# Patient Record
Sex: Female | Born: 2000 | Race: Black or African American | Hispanic: No | Marital: Single | Smoking: Never smoker
Health system: Southern US, Community
[De-identification: ages and names within clinical notes are randomized; demographics above are authoritative.]

## PROBLEM LIST (undated history)

## (undated) DIAGNOSIS — Z00129 Encounter for routine child health examination without abnormal findings: Secondary | ICD-10-CM

## (undated) DIAGNOSIS — Z003 Encounter for examination for adolescent development state: Secondary | ICD-10-CM

## (undated) HISTORY — DX: Encounter for examination for adolescent development state: Z00.3

## (undated) HISTORY — DX: Encounter for routine child health examination without abnormal findings: Z00.129

## (undated) HISTORY — PX: NO PAST SURGERIES: SHX2092

---
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
## 2021-02-26 ENCOUNTER — Ambulatory Visit
Admission: EM | Admit: 2021-02-26 | Discharge: 2021-02-26 | Disposition: A | Discharge status: Home / Self Care | Payer: BLUE CROSS/BLUE SHIELD | Attending: Physician Assistant | Admitting: Physician Assistant

## 2021-02-26 ENCOUNTER — Other Ambulatory Visit: Payer: Self-pay

## 2021-02-26 ENCOUNTER — Encounter: Payer: Self-pay | Admitting: Emergency Medicine

## 2021-02-26 DIAGNOSIS — J029 Acute pharyngitis, unspecified: Secondary | ICD-10-CM | POA: Diagnosis not present

## 2021-02-26 DIAGNOSIS — J0300 Acute streptococcal tonsillitis, unspecified: Secondary | ICD-10-CM | POA: Diagnosis not present

## 2021-02-26 LAB — GROUP A STREP BY PCR: Group A Strep by PCR: DETECTED — AB

## 2021-02-26 MED ORDER — IBUPROFEN 800 MG PO TABS: 800.0000 mg | ORAL_TABLET | Freq: Three times a day (TID) | ORAL | 0 refills | Status: AC | PRN

## 2021-02-26 MED ORDER — LIDOCAINE VISCOUS HCL 2 % MT SOLN: 15.0000 mL | OROMUCOSAL | 0 refills | Status: AC | PRN

## 2021-02-26 MED ORDER — AMOXICILLIN 500 MG PO CAPS: 500.0000 mg | ORAL_CAPSULE | Freq: Two times a day (BID) | ORAL | 0 refills | Status: AC

## 2021-02-26 NOTE — ED Triage Notes (Signed)
Patient c/o sore throat that started Tuesday. Denies any other symptoms.

## 2021-02-26 NOTE — ED Provider Notes (Signed)
[facility]    CSN: [id number] Arrival date & time: 02/26/21  6644      History   Chief Complaint Chief Complaint  Patient presents with  . Sore Throat    HPI Amber Bradley is a 20 y.o. female presenting for 2-day history of sore throat, painful swallowing and enlarged lymph nodes of her neck.  She denies any fever, fatigue, body aches, cough.  She says she has been slightly congested but does have allergies.  Patient denies any breathing difficulty or trouble swallowing.  No nausea/vomiting or diarrhea.  No sick contacts.  No known exposure to strep, mono, influenza or COVID-19.  Has been taking Tylenol for the throat pain, but says it has not really helped.  She says that the sore throat keeps getting worse.  No other complaints or concerns.  HPI  Past Medical History:  Diagnosis Date  . Healthy adolescent on routine physical examination     Patient Active Problem List   Diagnosis Date Noted  . 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 Problem of menstruation 02/15/2014  . 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 NO PAST SURGERIES      OB History   No obstetric history on file.      Home Medications    Prior to Admission medications   Medication Sig Start Date End Date Taking? Authorizing Provider  amoxicillin (AMOXIL) 500 MG capsule Take 1 capsule (500 mg total) by mouth 2 (two) times daily for 10 days. 02/26/21 03/08/21 Yes Shirlee Latch, PA-C  ibuprofen (ADVIL) 800 MG tablet Take 1 tablet (800 mg total) by mouth every 8 (eight) hours as needed for up to 5 days for moderate pain. 02/26/21 03/03/21 Yes Eusebio Friendly B, PA-C  lidocaine (XYLOCAINE) 2 % solution Use as directed 15 mLs in the mouth or throat every 3 (three) hours as needed for up to 3 days for mouth pain (swish and spit). 02/26/21 03/01/21 Yes Shirlee Latch, PA-C  Ferrous Gluconate (IRON 27 PO) Take by mouth.    [provider]  Multiple  Vitamin (MULTIVITAMIN) tablet Take 1 tablet by mouth daily.    [provider]  norethindrone-ethinyl estradiol-iron (ESTROSTEP FE,TILIA FE,TRI-LEGEST FE) 1-20/1-30/1-35 MG-MCG tablet Take 1 tablet by mouth daily.    [provider]    Family History Family History  Problem Relation Age of Onset  . Healthy Mother     Social History Social History   Tobacco Use  . Smoking status: Never Smoker  . Smokeless tobacco: Never Used  Vaping Use  . Vaping Use: Never used  Substance Use Topics  . Alcohol use: No  . Drug use: No     Allergies   Patient has no known allergies.   Review of Systems Review of Systems  Constitutional: Negative for chills, diaphoresis, fatigue and fever.  HENT: Positive for sore throat. Negative for congestion, ear pain, rhinorrhea, sinus pressure and sinus pain.   Respiratory: Negative for cough and shortness of breath.   Gastrointestinal: Negative for abdominal pain, nausea and vomiting.  Musculoskeletal: Negative for arthralgias and myalgias.  Skin: Negative for rash.  Neurological: Negative for weakness and headaches.  Hematological: Positive for adenopathy.     Physical Exam Triage Vital Signs ED Triage Vitals [02/26/21 1042]  Enc Vitals Group     BP (!) 133/91     Pulse Rate 96     Resp 18     Temp  98.8 F (37.1 C)     Temp Source Oral     SpO2 100 %     Weight      Height 5\' 4"  (1.626 m)     Head Circumference      Peak Flow      Pain Score 8     Pain Loc      Pain Edu?      Excl. in [place]?    No data found.  Updated Vital Signs BP (!) 133/91 (BP Location: Right Arm)   Pulse 96   Temp 98.8 F (37.1 C) (Oral)   Resp 18   Ht 5\' 4"  (1.626 m)   LMP 02/16/2021   SpO2 100%       Physical Exam Vitals and nursing note reviewed.  Constitutional:      General: She is not in acute distress.    Appearance: Normal appearance. She is well-developed. She is not ill-appearing or toxic-appearing.  HENT:     Head:  Normocephalic and atraumatic.     Nose: Nose normal. No congestion or rhinorrhea.     Mouth/Throat:     Mouth: Mucous membranes are moist.     Pharynx: Oropharynx is clear. Posterior oropharyngeal erythema present.     Tonsils: Tonsillar exudate (whitish exudates left tonsil) present. 2+ on the right. 2+ on the left.  Eyes:     General: No scleral icterus.       Right eye: No discharge.        Left eye: No discharge.     Conjunctiva/sclera: Conjunctivae normal.  Cardiovascular:     Rate and Rhythm: Normal rate and regular rhythm.     Heart sounds: Normal heart sounds.  Pulmonary:     Effort: Pulmonary effort is normal. No respiratory distress.     Breath sounds: Normal breath sounds.  Musculoskeletal:     Cervical back: Neck supple.  Lymphadenopathy:     Cervical: Cervical adenopathy (tender and enlarged anterior cervical lymph nodes bilaterally) present.  Skin:    General: Skin is dry.  Neurological:     General: No focal deficit present.     Mental Status: She is alert. Mental status is at baseline.     Motor: No weakness.     Gait: Gait normal.  Psychiatric:        Mood and Affect: Mood normal.        Behavior: Behavior normal.        Thought Content: Thought content normal.      UC Treatments / Results  Labs (all labs ordered are listed, but only abnormal results are displayed) Labs Reviewed  GROUP A STREP BY PCR - Abnormal; Notable for the following components:      Result Value   Group A Strep by PCR DETECTED (*)    All other components within normal limits    EKG   Radiology No results found.  Procedures Procedures (including critical care time)  Medications Ordered in UC Medications - No data to display  Initial Impression / Assessment and Plan / UC Course  I have reviewed the triage vital signs and the nursing notes.  Pertinent labs & imaging results that were available during my care of the patient were reviewed by me and considered in my medical  decision making (see chart for details).   20 year old female presenting for sore throat, painful swallowing and enlarged and tender lymph nodes of her neck x2 days.  Exam does reveal tonsillitis.  She  has 2+ tonsillar enlargement and erythema with whitish exudates on the left tonsil.  Also diffuse tender and enlarged bilateral anterior cervical lymphadenopathy.  Molecular strep test obtained today  Strep positive.  Treating at this time with amoxicillin.  Also sent viscous lidocaine and ibuprofen.  Advised to follow-up with our clinic as needed for any worsening symptoms or she is not improving over the next 2 days with this medication.  Work and school note given.   Final Clinical Impressions(s) / UC Diagnoses   Final diagnoses:  Strep tonsillitis  Sore throat     Discharge Instructions     Strep test is positive.  I sent antibiotics for you.  Also sent viscous lidocaine to help numb the throat.  You can take ibuprofen 2 if needed for the throat pain but it should be greatly improved over the next 2 to 3 days.  Go to ED if your throat swells or you have difficulty swallowing or high fevers.    ED Prescriptions    Medication Sig Dispense Auth. Provider   amoxicillin (AMOXIL) 500 MG capsule Take 1 capsule (500 mg total) by mouth 2 (two) times daily for 10 days. 20 capsule Eusebio Friendly B, PA-C   lidocaine (XYLOCAINE) 2 % solution Use as directed 15 mLs in the mouth or throat every 3 (three) hours as needed for up to 3 days for mouth pain (swish and spit). 100 mL Eusebio Friendly B, PA-C   ibuprofen (ADVIL) 800 MG tablet Take 1 tablet (800 mg total) by mouth every 8 (eight) hours as needed for up to 5 days for moderate pain. 15 tablet Gareth Morgan     PDMP not reviewed this encounter.   Shirlee Latch, PA-C 02/26/21 1123

## 2021-02-26 NOTE — Discharge Instructions (Signed)
Strep test is positive.  I sent antibiotics for you.  Also sent viscous lidocaine to help numb the throat.  You can take ibuprofen 2 if needed for the throat pain but it should be greatly improved over the next 2 to 3 days.  Go to ED if your throat swells or you have difficulty swallowing or high fevers.

## 2021-12-13 ENCOUNTER — Other Ambulatory Visit: Payer: Self-pay

## 2021-12-13 ENCOUNTER — Emergency Department
Admission: EM | Admit: 2021-12-13 | Discharge: 2021-12-13 | Disposition: A | Discharge status: Home / Self Care | Payer: BLUE CROSS/BLUE SHIELD | Attending: Emergency Medicine | Admitting: Emergency Medicine

## 2021-12-13 DIAGNOSIS — H938X3 Other specified disorders of ear, bilateral: Secondary | ICD-10-CM | POA: Insufficient documentation

## 2021-12-13 MED ORDER — AMOXICILLIN 500 MG PO CAPS: 500.0000 mg | ORAL_CAPSULE | Freq: Two times a day (BID) | ORAL | 0 refills | Status: AC

## 2021-12-13 MED ORDER — PSEUDOEPHEDRINE HCL 30 MG PO TABS: 30.0000 mg | ORAL_TABLET | Freq: Four times a day (QID) | ORAL | 2 refills | Status: AC | PRN

## 2021-12-13 NOTE — ED Triage Notes (Signed)
Pt comes pov with right ear pain for about a week. Also states a head cold

## 2021-12-18 NOTE — ED Provider Notes (Signed)
° °  [facility] Provider Note    Event Date/Time   First MD Initiated Contact with Patient 12/13/21 1542     (approximate)   History   Ear Pain   HPI  Amber Bradley is a 21 y.o. female who presents with complaints of feeling of congestion in her ears bilaterally.  Patient also feels that she has some sinus pressure as well.  She denies fevers or chills, no nausea or vomiting.  No body aches.     Physical Exam   Triage Vital Signs: ED Triage Vitals  Enc Vitals Group     BP 12/13/21 1540 (!) 161/88     Pulse Rate 12/13/21 1540 (!) 119     Resp 12/13/21 1540 20     Temp 12/13/21 1540 98.1 F (36.7 C)     Temp Source 12/13/21 1540 Oral     SpO2 12/13/21 1540 99 %     Weight 12/13/21 1540 100 kg (220 lb 7.4 oz)     Height --      Head Circumference --      Peak Flow --      Pain Score 12/13/21 1536 7     Pain Loc --      Pain Edu? --      Excl. in [place]? --     Most recent vital signs: Vitals:   12/13/21 1540  BP: (!) 161/88  Pulse: (!) 119  Resp: 20  Temp: 98.1 F (36.7 C)  SpO2: 99%     General: Awake, no distress.  CV:  Good peripheral perfusion.  Heart rate is normal on my exam, no tachycardia Resp:  Normal effort.  Abd:  No distention.  Other:  Ears: Mild bulging of the right TM, left TM is normal, mild tenderness over the maxillary sinuses bilaterally   ED Results / Procedures / Treatments   Labs (all labs ordered are listed, but only abnormal results are displayed) Labs Reviewed - No data to display   EKG     RADIOLOGY     PROCEDURES:  Critical Care performed:   Procedures   MEDICATIONS ORDERED IN ED: Medications - No data to display   IMPRESSION / MDM / ASSESSMENT AND PLAN / ED COURSE  I reviewed the triage vital signs and the nursing notes.  Patient very well-appearing in no acute distress, symptoms are most consistent with possible sinusitis, likely viral  Some bulging of the TM on the right,  suspect related to congestion.  Regardless she does have some discomfort, will prescribe decongestant, amoxicillin if symptoms do not improve with decongestant.          FINAL CLINICAL IMPRESSION(S) / ED DIAGNOSES   Final diagnoses:  Ear congestion, bilateral     Rx / DC Orders   ED Discharge Orders          Ordered    pseudoephedrine (SUDAFED) 30 MG tablet  Every 6 hours PRN        12/13/21 1551    amoxicillin (AMOXIL) 500 MG capsule  2 times daily        12/13/21 1551             Note:  This document was prepared using Dragon voice recognition software and may include unintentional dictation errors.   Jene Every, MD 12/18/21 [id number]

## 2023-10-26 ENCOUNTER — Other Ambulatory Visit: Payer: Self-pay

## 2023-10-26 ENCOUNTER — Emergency Department: Payer: Medicaid Other

## 2023-10-26 ENCOUNTER — Emergency Department
Admission: EM | Admit: 2023-10-26 | Discharge: 2023-10-26 | Disposition: A | Discharge status: Home / Self Care | Payer: Medicaid Other | Attending: Emergency Medicine | Admitting: Emergency Medicine

## 2023-10-26 DIAGNOSIS — Z20822 Contact with and (suspected) exposure to covid-19: Secondary | ICD-10-CM | POA: Insufficient documentation

## 2023-10-26 DIAGNOSIS — R059 Cough, unspecified: Secondary | ICD-10-CM | POA: Diagnosis present

## 2023-10-26 DIAGNOSIS — J069 Acute upper respiratory infection, unspecified: Secondary | ICD-10-CM | POA: Insufficient documentation

## 2023-10-26 LAB — RESP PANEL BY RT-PCR (RSV, FLU A&B, COVID)  RVPGX2
Influenza A by PCR: NEGATIVE
Influenza B by PCR: NEGATIVE
Resp Syncytial Virus by PCR: NEGATIVE
SARS Coronavirus 2 by RT PCR: NEGATIVE

## 2023-10-26 MED ORDER — PSEUDOEPH-BROMPHEN-DM 30-2-10 MG/5ML PO SYRP
5.0000 mL | ORAL_SOLUTION | Freq: Four times a day (QID) | ORAL | 0 refills | Status: AC | PRN
Start: 2023-10-26 — End: ?

## 2023-10-26 NOTE — Discharge Instructions (Signed)
Follow up with Urgent care if any continued problems.  Increase fluids, tylenol as needed.  Bromofed DM as needed for cough and congestion.

## 2023-10-26 NOTE — ED Triage Notes (Signed)
Pt presents to ED with c/o of cough for the past week, pt denies fevers. NAD noted. Pt states she works in [employer].

## 2023-10-26 NOTE — ED Provider Notes (Signed)
   [facility] Provider Note    None    (approximate)   History   Cough   HPI  SHERIDYN SATKOWSKI is a 22 y.o. female  presents to ED with cough, congestion, sneezing for 1 week.  No N.V or D.  No fever.  No known sick contacts.  Patient has been taking Mucinex at home for congestion.      Physical Exam   Triage Vital Signs: ED Triage Vitals  Encounter Vitals Group     BP 10/26/23 1139 (!) 167/89     Systolic BP Percentile --      Diastolic BP Percentile --      Pulse Rate 10/26/23 1139 (!) 125     Resp 10/26/23 1139 18     Temp 10/26/23 1139 98.7 F (37.1 C)     Temp Source 10/26/23 1139 Oral     SpO2 10/26/23 1139 100 %     Weight 10/26/23 1140 226 lb (102.5 kg)     Height 10/26/23 1140 5\' 4"  (1.626 m)     Head Circumference --      Peak Flow --      Pain Score 10/26/23 1140 0     Pain Loc --      Pain Education --      Exclude from Growth Chart --     Most recent vital signs: Vitals:   10/26/23 1139  BP: (!) 167/89  Pulse: (!) 125  Resp: 18  Temp: 98.7 F (37.1 C)  SpO2: 100%     General: Awake, no distress.  CV:  Good peripheral perfusion.  Heart regular rate and rhythm. Resp:  Normal effort.  Clear bilaterally. Abd:  No distention.  Other:  Nasal congestion.   ED Results / Procedures / Treatments   Labs (all labs ordered are listed, but only abnormal results are displayed) Labs Reviewed  RESP PANEL BY RT-PCR (RSV, FLU A&B, COVID)  [id number]      PROCEDURES:  Critical Care performed:   Procedures   MEDICATIONS ORDERED IN ED: Medications - No data to display   IMPRESSION / MDM / ASSESSMENT AND PLAN / ED COURSE  I reviewed the triage vital signs and the nursing notes.   Differential diagnosis includes, but is not limited to, COVID, influenza, RSV, pneumonia, bronchitis, viral URI.  22 year old female presents to the ED with complaint of cough and congestion for the past week.  Respiratory panel was negative  and chest x-ray did not show any signs of pneumonia.  Patient is agreeable with a prescription for a different cough medication, Tylenol and increase fluids.  She will follow-up with urgent care if any continued problems.  Also she requested a note for tonight as she works night shift.      Patient's presentation is most consistent with acute complicated illness / injury requiring diagnostic workup.  FINAL CLINICAL IMPRESSION(S) / ED DIAGNOSES   Final diagnoses:  Viral URI with cough     Rx / DC Orders   ED Discharge Orders          Ordered    brompheniramine-pseudoephedrine-DM 30-2-10 MG/5ML syrup  4 times daily PRN        10/26/23 1346             Note:  This document was prepared using Dragon voice recognition software and may include unintentional dictation errors.   Tommi Rumps, PA-C 10/26/23 1356    Corena Herter, MD 10/26/23 1556
